# Patient Record
Sex: Male | Born: 1975 | Race: White | Hispanic: No | Marital: Single | Smoking: Current every day smoker
Health system: Southern US, Community
[De-identification: ages and names within clinical notes are randomized; demographics above are authoritative.]

## PROBLEM LIST (undated history)

## (undated) DIAGNOSIS — E785 Hyperlipidemia, unspecified: Secondary | ICD-10-CM

## (undated) DIAGNOSIS — I10 Essential (primary) hypertension: Secondary | ICD-10-CM

## (undated) DIAGNOSIS — E119 Type 2 diabetes mellitus without complications: Secondary | ICD-10-CM

## (undated) HISTORY — DX: Hyperlipidemia, unspecified: E78.5

## (undated) HISTORY — DX: Essential (primary) hypertension: I10

## (undated) HISTORY — DX: Type 2 diabetes mellitus without complications: E11.9

## (undated) HISTORY — PX: KNEE SURGERY: SHX244

---

## 2010-02-17 ENCOUNTER — Emergency Department: Payer: Self-pay

## 2012-10-15 ENCOUNTER — Ambulatory Visit: Payer: Self-pay

## 2016-01-14 ENCOUNTER — Encounter: Payer: Managed Care, Other (non HMO) | Attending: Family Medicine | Admitting: *Deleted

## 2016-01-14 ENCOUNTER — Encounter: Payer: Self-pay | Admitting: *Deleted

## 2016-01-14 VITALS — BP 120/90 | Ht 72.0 in | Wt 199.6 lb

## 2016-01-14 DIAGNOSIS — E119 Type 2 diabetes mellitus without complications: Secondary | ICD-10-CM | POA: Insufficient documentation

## 2016-01-14 NOTE — Patient Instructions (Addendum)
Check blood sugars 2 x day before breakfast and 2 hrs after supper every day Exercise: Begin walking for  10-15  minutes  3-4 days a week and gradually increase to 150 minutes/week No strenuous exercise until blood sugars less than 250  Eat 3 meals day, 1-2  snacks a day Space meals 4-6 hours apart Avoid sugar sweetened drinks (tea) Drink plenty of water Make an eye doctor appointment Quit smoking Bring blood sugar records to the next class

## 2016-01-14 NOTE — Progress Notes (Signed)
Diabetes Self-Management Education  Visit Type: First/Initial  Appt. Start Time: 0910 Appt. End Time: 1025  01/14/2016  Mr. Clinton Clark, identified by name and date of birth, is a 40 y.o. male with a diagnosis of Diabetes: Type 2.   ASSESSMENT  Blood pressure 120/90, height 6' (1.829 m), weight 199 lb 9.6 oz (90.538 kg). Body mass index is 27.06 kg/(m^2).      Diabetes Self-Management Education - 01/14/16 1136    Visit Information   Visit Type First/Initial   Initial Visit   Diabetes Type Type 2   Are you currently following a meal plan? No   Are you taking your medications as prescribed? Yes   Date Diagnosed Sept 2016   Health Coping   How would you rate your overall health? Fair   Psychosocial Assessment   Patient Belief/Attitude about Diabetes Motivated to manage diabetes  "I want to live for my 2 small children"   Self-care barriers None   Self-management support Doctor's office;Family   Other persons present Spouse/SO   Patient Concerns Nutrition/Meal planning;Medication;Healthy Lifestyle;Glycemic Control   Special Needs None   Preferred Learning Style Auditory;Visual;Hands on   Learning Readiness Change in progress   How often do you need to have someone help you when you read instructions, pamphlets, or other written materials from your doctor or pharmacy? 1 - Never   What is the last grade level you completed in school? 12th   Complications   Last HgB A1C per patient/outside source 13.1 %  08/17/15   How often do you check your blood sugar? 1-2 times/day   Fasting Blood glucose range (mg/dL) >161  Pt reports FBG's usually 200's mg/dL.    Postprandial Blood glucose range (mg/dL) --  Pt reports blood sugars prior to supper are 200-350's mg/dL.   Have you had a dilated eye exam in the past 12 months? No   Have you had a dental exam in the past 12 months? Yes   Are you checking your feet? No   Dietary Intake   Breakfast oatmeal; eggs with trukey bacon; cottage  cheese and fruit   Lunch leftovers from supper   Dinner grilled fish or chicken or meat with rice, potatoes, pasta, green vegetables   Beverage(s) water, sugar sweetened tea   Exercise   Exercise Type ADL's   Patient Education   Previous Diabetes Education No   Disease state  Definition of diabetes, type 1 and 2, and the diagnosis of diabetes   Nutrition management  Role of diet in the treatment of diabetes and the relationship between the three main macronutrients and blood glucose level;Carbohydrate counting   Physical activity and exercise  Role of exercise on diabetes management, blood pressure control and cardiac health.   Medications Reviewed patients medication for diabetes, action, purpose, timing of dose and side effects.   Monitoring Purpose and frequency of SMBG.;Identified appropriate SMBG and/or A1C goals.   Chronic complications Relationship between chronic complications and blood glucose control   Psychosocial adjustment Identified and addressed patients feelings and concerns about diabetes   Personal strategies to promote health Review risk of smoking and offered smoking cessation   Individualized Goals (developed by patient)   Reducing Risk Improve blood sugars Decrease medications Prevent diabetes complications Lead a healthier lifestyle Become more fit Quit smoking   Outcomes   Expected Outcomes Demonstrated interest in learning. Expect positive outcomes      Individualized Plan for Diabetes Self-Management Training:   Learning Objective:  Patient will have  a greater understanding of diabetes self-management. Patient education plan is to attend individual and/or group sessions per assessed needs and concerns.   Plan:   Patient Instructions  Check blood sugars 2 x day before breakfast and 2 hrs after supper every day Exercise: Begin walking for  10-15  minutes  3-4 days a week and gradually increase to 150 minutes/week No strenuous exercise until blood sugars  less than 250  Eat 3 meals day, 1-2  snacks a day Space meals 4-6 hours apart Avoid sugar sweetened drinks (tea) Drink plenty of water Make an eye doctor appointment Quit smoking Bring blood sugar records to the next class  Expected Outcomes:  Demonstrated interest in learning. Expect positive outcomes  Education material provided:  General Meal Planning Guidelines Simple Meal Plan  If problems or questions, patient to contact team via:   Sharion Settler, RN, CCM, CDE [phone]  Future DSME appointment:   Monday February 04, 2016 for Class 1

## 2016-02-04 ENCOUNTER — Telehealth: Payer: Self-pay | Admitting: Dietician

## 2016-02-04 ENCOUNTER — Ambulatory Visit: Payer: Managed Care, Other (non HMO)

## 2016-02-04 NOTE — Telephone Encounter (Signed)
Pt did not come to diabetes class tonight-called pt-no answer-left message on voice mail for pt to call us to reschedule diabetes classes and that new class series begins on 03-03-16-pt to call us to confirm

## 2016-02-11 ENCOUNTER — Ambulatory Visit: Payer: Managed Care, Other (non HMO)

## 2016-02-18 ENCOUNTER — Ambulatory Visit: Payer: Managed Care, Other (non HMO)

## 2016-03-06 ENCOUNTER — Encounter: Payer: Self-pay | Admitting: *Deleted

## 2016-08-10 ENCOUNTER — Encounter: Payer: Self-pay | Admitting: *Deleted

## 2016-08-10 ENCOUNTER — Ambulatory Visit (INDEPENDENT_AMBULATORY_CARE_PROVIDER_SITE_OTHER): Payer: Managed Care, Other (non HMO)

## 2016-08-10 ENCOUNTER — Ambulatory Visit
Admission: EM | Admit: 2016-08-10 | Discharge: 2016-08-10 | Disposition: A | Discharge status: Home / Self Care | Payer: Managed Care, Other (non HMO) | Attending: Family Medicine | Admitting: Family Medicine

## 2016-08-10 DIAGNOSIS — T148 Other injury of unspecified body region: Secondary | ICD-10-CM

## 2016-08-10 DIAGNOSIS — IMO0002 Reserved for concepts with insufficient information to code with codable children: Secondary | ICD-10-CM

## 2016-08-10 MED ORDER — LIDOCAINE HCL (PF) 1 % IJ SOLN
2.0000 mL | Freq: Once | INTRAMUSCULAR | Status: AC
  Administered 2016-08-10: 2 mL

## 2016-08-10 MED ORDER — NAPROXEN 500 MG PO TABS: 500.0000 mg | ORAL_TABLET | Freq: Two times a day (BID) | ORAL | 0 refills | Status: AC

## 2016-08-10 MED ORDER — CEPHALEXIN 500 MG PO CAPS: 500.0000 mg | ORAL_CAPSULE | Freq: Four times a day (QID) | ORAL | 0 refills | Status: DC

## 2016-08-10 MED ORDER — TETANUS-DIPHTH-ACELL PERTUSSIS 5-2.5-18.5 LF-MCG/0.5 IM SUSP
0.5000 mL | Freq: Once | INTRAMUSCULAR | Status: AC
  Administered 2016-08-10: 0.5 mL via INTRAMUSCULAR

## 2016-08-10 NOTE — ED Triage Notes (Signed)
Patient was adjusting a saw blade when he lacerated the palm of his right hand.

## 2016-08-10 NOTE — Discharge Instructions (Signed)
Wash with soap and water  Apply small amount of neosporin to area.

## 2016-08-10 NOTE — ED Provider Notes (Signed)
CSN: [id number]     Arrival date & time 08/10/16  1438 History   None    Chief Complaint  Patient presents with  . Extremity Laceration   (Consider location/radiation/quality/duration/timing/severity/associated sxs/prior Treatment) Pt came in with a cut to rt hand after working on a saw approx 1 hour ago. Cut is to the palm of rt hand. Able to move all digits on hand, bleeding controlled.area is approx 2x2 flap. Unknown if pt has had a tetnus in the past 10 yrs. Hand wrapped in gauze prior to arrival.       Past Medical History:  Diagnosis Date  . Diabetes mellitus without complication (HCC)   . Hyperlipidemia   . Hypertension    Past Surgical History:  Procedure Laterality Date  . KNEE SURGERY Left    acl, mcl   Family History  Problem Relation Age of Onset  . Diabetes Maternal Grandmother    Social History  Substance Use Topics  . Smoking status: Current Every Day Smoker    Packs/day: 1.00    Years: 18.00    Types: Cigarettes  . Smokeless tobacco: Never Used  . Alcohol use 3.0 oz/week    5 Cans of beer per week    Review of Systems  Allergies  Review of patient's allergies indicates no known allergies.  Home Medications   Prior to Admission medications   Medication Sig Start Date End Date Taking? Authorizing Provider  fenofibrate (TRICOR) 145 MG tablet Take 1 tablet by mouth daily. 08/17/15 08/16/16 Yes Historical Provider, MD  metFORMIN (GLUCOPHAGE) 850 MG tablet Take 1 tablet by mouth 2 (two) times daily. 08/18/15 08/17/16 Yes Historical Provider, MD  metoprolol (LOPRESSOR) 100 MG tablet Take 1 tablet by mouth 2 (two) times daily. 08/17/15 08/16/16 Yes Historical Provider, MD  omeprazole (PRILOSEC) 20 MG capsule Take 1 capsule by mouth daily. 08/17/15  Yes Historical Provider, MD  cephALEXin (KEFLEX) 500 MG capsule Take 1 capsule (500 mg total) by mouth 4 (four) times daily. 08/10/16   Tobi Bastos, NP  naproxen (NAPROSYN) 500 MG tablet Take 1 tablet (500 mg  total) by mouth 2 (two) times daily. 08/10/16   Tobi Bastos, NP  nystatin cream (MYCOSTATIN) Apply 1 application topically 2 (two) times daily as needed. 08/17/15 08/16/16  Historical Provider, MD  Omega-3 Fatty Acids (FISH OIL TRIPLE STRENGTH) 1400 MG CAPS Take 1 capsule by mouth daily.    Historical Provider, MD   Meds Ordered and Administered this Visit   Medications  Tdap (BOOSTRIX) injection 0.5 mL (0.5 mLs Intramuscular Given 08/10/16 1521)  lidocaine (PF) (XYLOCAINE) 1 % injection 2 mL (2 mLs Other Given 08/10/16 1537)    BP (!) 133/98 (BP Location: Left Arm)   Pulse 82   Temp 97.9 F (36.6 C) (Oral)   Resp 16   Ht 6' (1.829 m)   Wt 200 lb (90.7 kg)   SpO2 97%   BMI 27.12 kg/m  No data found.   Physical Exam  Constitutional: He appears well-developed and well-nourished.  Cardiovascular: Normal rate, regular rhythm and normal heart sounds.   Pulmonary/Chest: Effort normal and breath sounds normal.  Musculoskeletal: Normal range of motion.  Skin: Skin is warm.  Laceration to palm of rt hand approx 2x2 flap near middle finger near web of digit     Urgent Care Course   Clinical Course    .Marland KitchenLaceration Repair Date/Time: 08/10/2016 3:16 PM Performed by: Maple Mirza A Authorized by: Maple Mirza A   Consent:  Consent obtained:  Verbal   Consent given by:  Patient   Risks discussed:  Infection, need for additional repair, pain, poor cosmetic result, tendon damage, poor wound healing and nerve damage   Alternatives discussed:  No treatment Anesthesia (see MAR for exact dosages):    Anesthesia method:  Local infiltration   Local anesthetic:  Lidocaine 1% WITH epi Laceration details:    Location:  Hand   Hand location:  R palm Repair type:    Repair type:  Intermediate Pre-procedure details:    Preparation:  Patient was prepped and draped in usual sterile fashion and imaging obtained to evaluate for foreign bodies Exploration:    Wound exploration:  wound explored through full range of motion and entire depth of wound probed and visualized   Treatment:    Area cleansed with:  Betadine   Amount of cleaning:  Standard   Irrigation solution:  Sterile saline   Irrigation method:  Pressure wash   Visualized foreign bodies/material removed: no   Skin repair:    Repair method:  Sutures   Suture size:  5-0   Suture material:  Nylon   Suture technique:  Simple interrupted Post-procedure details:    Dressing:  Antibiotic ointment, non-adherent dressing and adhesive bandage   Patient tolerance of procedure:  Tolerated well, no immediate complications     (including critical care time)  Labs Review Labs Reviewed - No data to display  Imaging Review Dg Hand Complete Right  Result Date: 08/10/2016 CLINICAL DATA:  Acute laceration injury to right hand today with saw. Initial encounter. EXAM: RIGHT HAND - COMPLETE 3+ VIEW COMPARISON:  None. FINDINGS: No acute fracture, subluxation or dislocation identified. No definite radiopaque foreign bodies noted. No focal bony lesions are present. IMPRESSION: Negative. Electronically Signed   By: Harmon PierJeffrey  Hu M.D.   On: 08/10/2016 15:36     Visual Acuity Review  Right Eye Distance:   Left Eye Distance:   Bilateral Distance:    Right Eye Near:   Left Eye Near:    Bilateral Near:         MDM   1. Laceration    Take full dose of abx Need to follow up with a hand specialist  Sutures will need to be removed in 7-10 days. Wash and dry area and apply thin layer layer of neosporin       Tobi BastosMelanie A Mardene Lessig, NP 08/10/16 1648

## 2016-08-12 ENCOUNTER — Telehealth: Payer: Self-pay | Admitting: *Deleted

## 2016-08-12 NOTE — Telephone Encounter (Signed)
Follow up call to ask patient how he was doing. Patient reports that he is in some pain from his hand laceration, but otherwise he is ok. Encouraged patient to follow up with [facility] or PCP if laceration becomes inflamed, red, or develops drainage. Patient will follow up with [facility] in 1 week to remove sutures.

## 2018-02-03 IMAGING — CR DG HAND COMPLETE 3+V*R*
3 series · 3 of 3 positions shown · non-contrast
Comparison: None.

CLINICAL DATA: Acute laceration injury to right hand today with
saw. Initial encounter.

EXAM:
RIGHT HAND - COMPLETE 3+ VIEW

[hand ap]
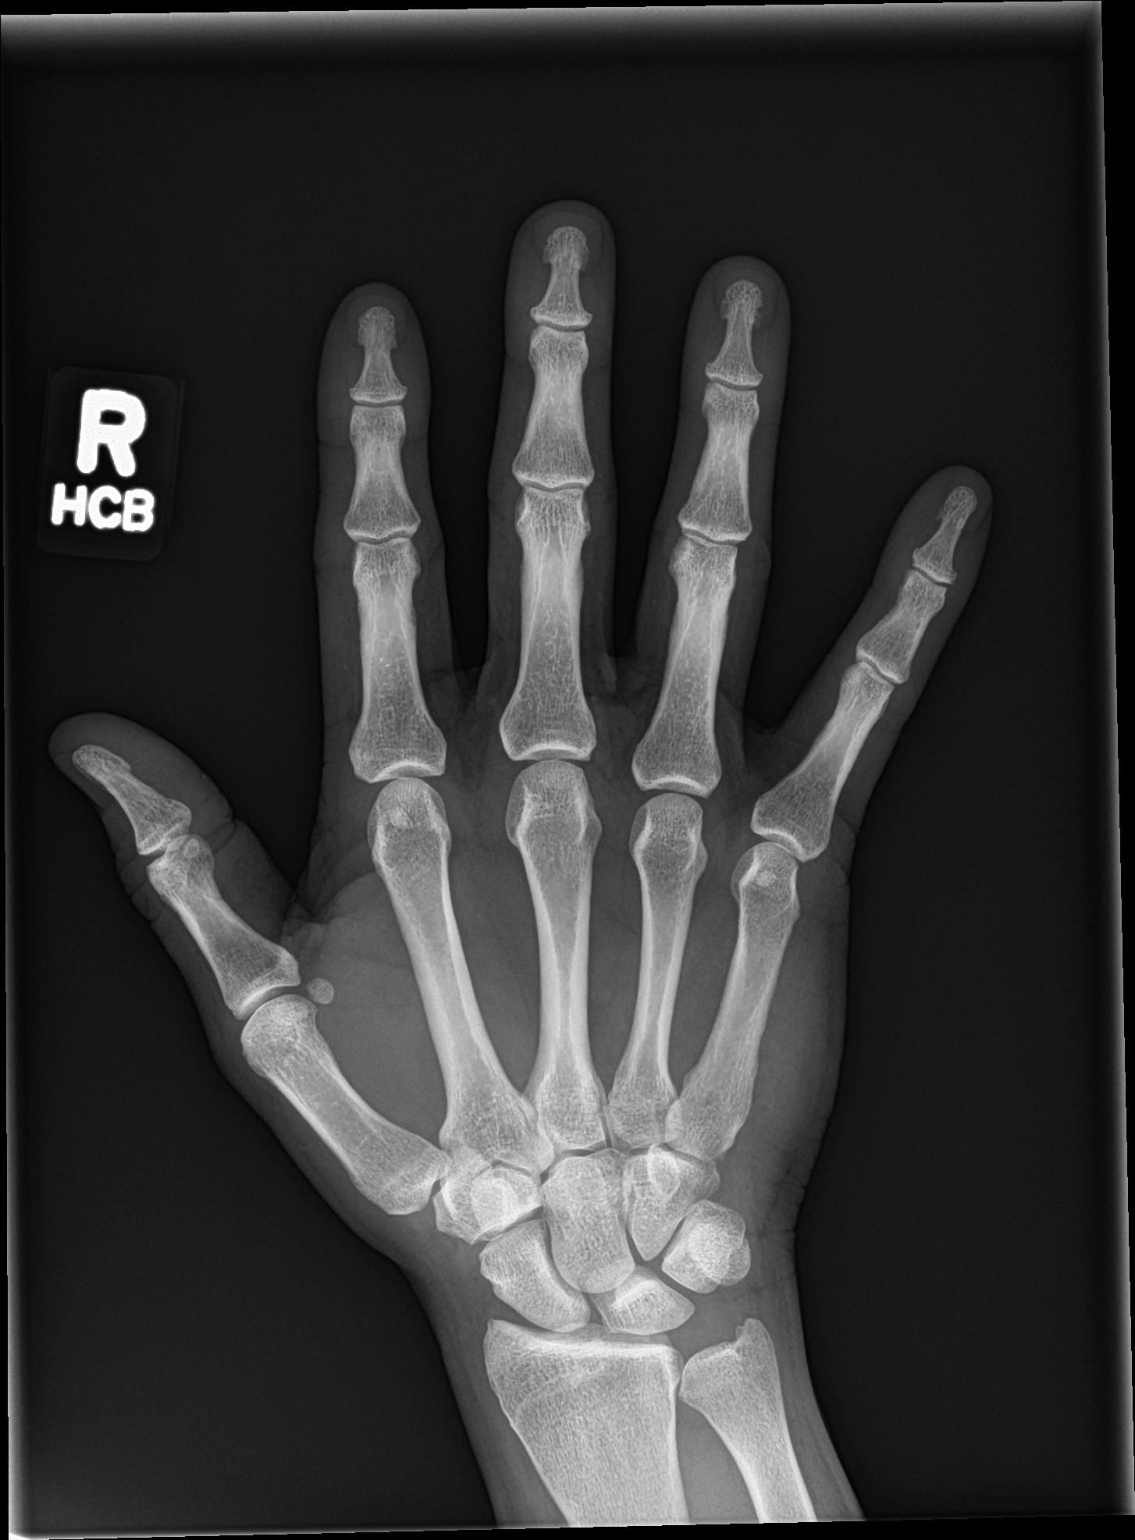

[hand obl]
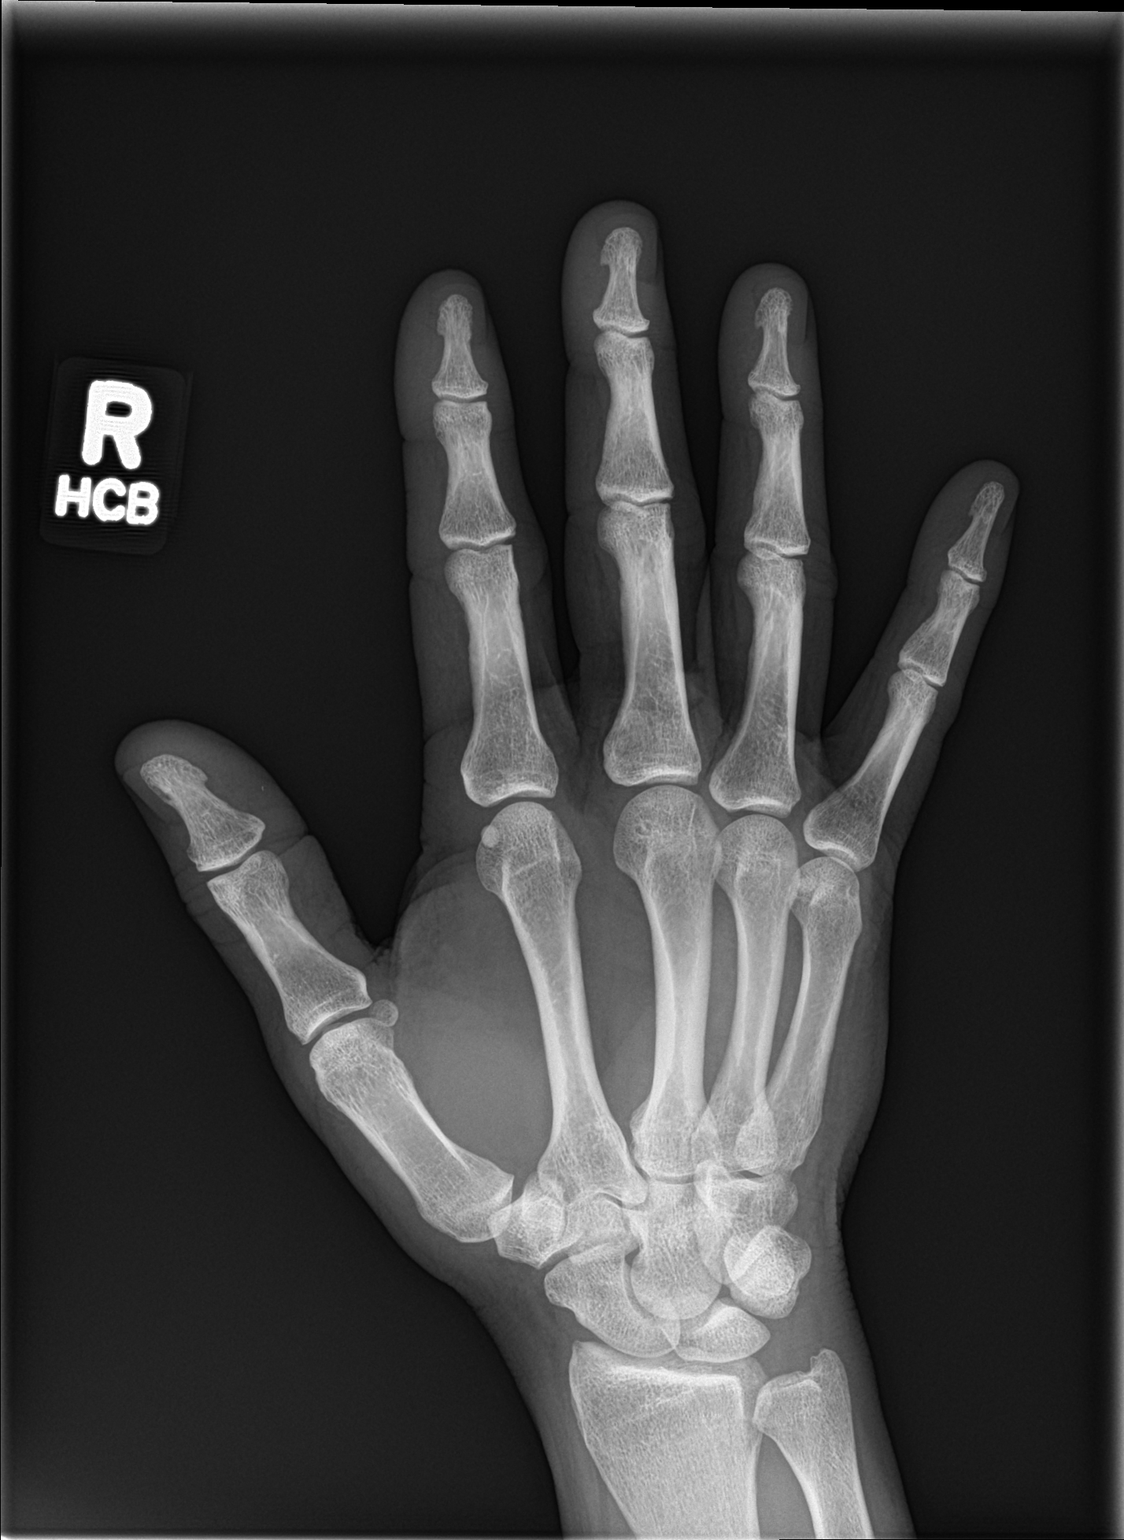

[hand lat]
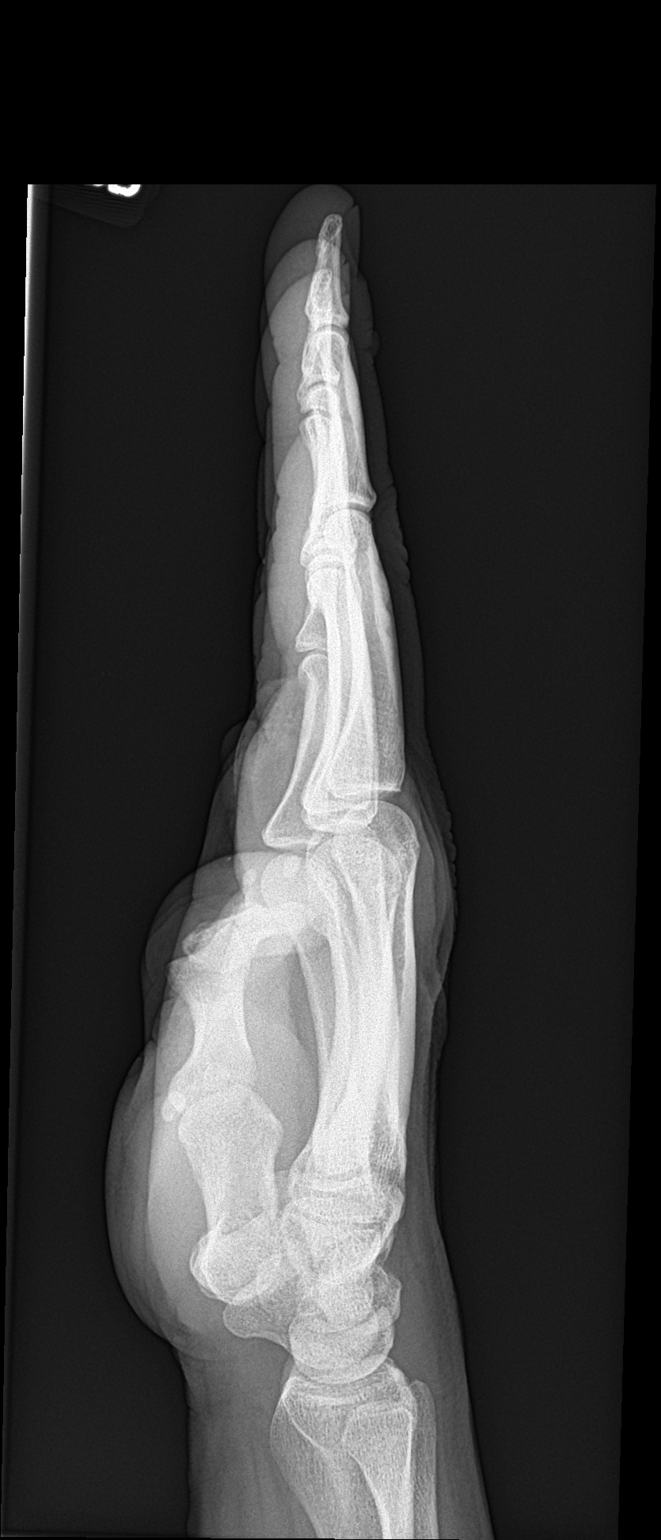

[3 of 3 positions shown; findings below may reference images not displayed]

FINDINGS: No acute fracture, subluxation or dislocation identified.

No definite radiopaque foreign bodies noted.

No focal bony lesions are present.
IMPRESSION: Negative.

## 2021-07-24 ENCOUNTER — Other Ambulatory Visit: Payer: Self-pay

## 2021-07-24 ENCOUNTER — Emergency Department
Admission: EM | Admit: 2021-07-24 | Discharge: 2021-07-24 | Disposition: A | Discharge status: Home / Self Care | Payer: 59 | Attending: Emergency Medicine | Admitting: Emergency Medicine

## 2021-07-24 ENCOUNTER — Ambulatory Visit (INDEPENDENT_AMBULATORY_CARE_PROVIDER_SITE_OTHER): Payer: 59

## 2021-07-24 ENCOUNTER — Ambulatory Visit
Admission: EM | Admit: 2021-07-24 | Discharge: 2021-07-24 | Disposition: A | Discharge status: Other Acute Inpatient Hospital | Payer: 59

## 2021-07-24 DIAGNOSIS — W293XXA Contact with powered garden and outdoor hand tools and machinery, initial encounter: Secondary | ICD-10-CM | POA: Diagnosis not present

## 2021-07-24 DIAGNOSIS — Y9389 Activity, other specified: Secondary | ICD-10-CM | POA: Diagnosis not present

## 2021-07-24 DIAGNOSIS — Z79899 Other long term (current) drug therapy: Secondary | ICD-10-CM | POA: Diagnosis not present

## 2021-07-24 DIAGNOSIS — Z23 Encounter for immunization: Secondary | ICD-10-CM | POA: Diagnosis not present

## 2021-07-24 DIAGNOSIS — S62522B Displaced fracture of distal phalanx of left thumb, initial encounter for open fracture: Secondary | ICD-10-CM

## 2021-07-24 DIAGNOSIS — S61112A Laceration without foreign body of left thumb with damage to nail, initial encounter: Secondary | ICD-10-CM | POA: Insufficient documentation

## 2021-07-24 DIAGNOSIS — F17210 Nicotine dependence, cigarettes, uncomplicated: Secondary | ICD-10-CM | POA: Diagnosis not present

## 2021-07-24 DIAGNOSIS — S6992XA Unspecified injury of left wrist, hand and finger(s), initial encounter: Secondary | ICD-10-CM | POA: Diagnosis present

## 2021-07-24 DIAGNOSIS — I10 Essential (primary) hypertension: Secondary | ICD-10-CM | POA: Insufficient documentation

## 2021-07-24 DIAGNOSIS — Z7984 Long term (current) use of oral hypoglycemic drugs: Secondary | ICD-10-CM | POA: Diagnosis not present

## 2021-07-24 DIAGNOSIS — E119 Type 2 diabetes mellitus without complications: Secondary | ICD-10-CM | POA: Diagnosis not present

## 2021-07-24 DIAGNOSIS — S61122A Laceration with foreign body of left thumb with damage to nail, initial encounter: Secondary | ICD-10-CM

## 2021-07-24 MED ORDER — CEPHALEXIN 500 MG PO CAPS
1000.0000 mg | ORAL_CAPSULE | Freq: Once | ORAL | Status: AC
  Administered 2021-07-24: 1000 mg via ORAL
  Filled 2021-07-24: qty 2

## 2021-07-24 MED ORDER — SULFAMETHOXAZOLE-TRIMETHOPRIM 800-160 MG PO TABS
1.0000 | ORAL_TABLET | Freq: Once | ORAL | Status: AC
  Administered 2021-07-24: 1 via ORAL
  Filled 2021-07-24: qty 1

## 2021-07-24 MED ORDER — LIDOCAINE HCL (PF) 1 % IJ SOLN
10.0000 mL | Freq: Once | INTRAMUSCULAR | Status: AC
  Administered 2021-07-24: 10 mL via INTRADERMAL
  Filled 2021-07-24: qty 10

## 2021-07-24 MED ORDER — BUPIVACAINE HCL 0.5 % IJ SOLN: 10.0000 mL | Freq: Once | INTRAMUSCULAR | Status: DC

## 2021-07-24 MED ORDER — CEPHALEXIN 500 MG PO CAPS: 500.0000 mg | ORAL_CAPSULE | Freq: Four times a day (QID) | ORAL | 0 refills | Status: AC

## 2021-07-24 MED ORDER — TETANUS-DIPHTH-ACELL PERTUSSIS 5-2.5-18.5 LF-MCG/0.5 IM SUSY
0.5000 mL | PREFILLED_SYRINGE | Freq: Once | INTRAMUSCULAR | Status: AC
  Administered 2021-07-24: 0.5 mL via INTRAMUSCULAR
  Filled 2021-07-24: qty 0.5

## 2021-07-24 MED ORDER — SULFAMETHOXAZOLE-TRIMETHOPRIM 800-160 MG PO TABS: 1.0000 | ORAL_TABLET | Freq: Two times a day (BID) | ORAL | 0 refills | Status: AC

## 2021-07-24 MED ORDER — BUPIVACAINE HCL 0.25 % IJ SOLN
10.0000 mL | Freq: Once | INTRAMUSCULAR | Status: AC
  Administered 2021-07-24: 10 mL
  Filled 2021-07-24: qty 10

## 2021-07-24 NOTE — ED Provider Notes (Signed)
[facility] Emergency Department Provider Note  ____________________________________________   Event Date/Time   First MD Initiated Contact with Patient 07/24/21 1834     (approximate)  I have reviewed the triage vital signs and the nursing notes.   HISTORY  Chief Complaint Laceration   HPI Clinton Clark is a 45 y.o. male who presents to the emergency department for evaluation of laceration to the left thumb.  Patient states that he was using a band saw to cut a small fragment off of an aluminum pipe when it caught and his left thumb struck the blade.  He first presented to urgent care, where x-rays were obtained and there was concern regarding fracture site and he was sent here for further evaluation.  The patient reports that the tip of his thumb is numb, however he denies significant pain at this time.  He reports his last tetanus was roughly 5 years ago.  He denies any other complaints.        Past Medical History:  Diagnosis Date   Diabetes mellitus without complication (HCC)    Hyperlipidemia    Hypertension     There are no problems to display for this patient.   Past Surgical History:  Procedure Laterality Date   KNEE SURGERY Left    acl, mcl    Prior to Admission medications   Medication Sig Start Date End Date Taking? Authorizing Provider  cephALEXin (KEFLEX) 500 MG capsule Take 1 capsule (500 mg total) by mouth 4 (four) times daily for 10 days. 07/24/21 08/03/21 Yes Ariyonna Twichell, Ruben Gottron, PA  sulfamethoxazole-trimethoprim (BACTRIM DS) 800-160 MG tablet Take 1 tablet by mouth 2 (two) times daily for 10 days. 07/24/21 08/03/21 Yes Wisdom Rickey, Ruben Gottron, PA  clonazePAM (KLONOPIN) 0.5 MG tablet Take 0.25-0.5 mg by mouth daily as needed. 05/03/21   [provider]  fenofibrate 160 MG tablet Take 160 mg by mouth daily. 07/21/21   [provider]  lisinopril (ZESTRIL) 20 MG tablet Take 20 mg by mouth daily. 07/21/21   [provider]  metFORMIN (GLUCOPHAGE) 850 MG tablet Take 1 tablet by mouth 2 (two) times daily. 08/18/15 07/24/21  [provider]  metoprolol (LOPRESSOR) 100 MG tablet Take 1 tablet by mouth 2 (two) times daily. 08/17/15 08/16/16  [provider]  naproxen (NAPROSYN) 500 MG tablet Take 1 tablet (500 mg total) by mouth 2 (two) times daily. 08/10/16   Coralyn Mark, NP  Omega-3 Fatty Acids (FISH OIL TRIPLE STRENGTH) 1400 MG CAPS Take 1 capsule by mouth daily.    [provider]  omeprazole (PRILOSEC) 20 MG capsule Take 1 capsule by mouth daily. 08/17/15   [provider]    Allergies Patient has no known allergies.  Family History  Problem Relation Age of Onset   Diabetes Maternal Grandmother     Social History Social History   Tobacco Use   Smoking status: Every Day    Packs/day: 1.00    Years: 18.00    Pack years: 18.00    Types: Cigarettes   Smokeless tobacco: Never  Vaping Use   Vaping Use: Never used  Substance Use Topics   Alcohol use: Yes    Alcohol/week: 5.0 standard drinks    Types: 5 Cans of beer per week    Review of Systems  Constitutional: No fever/chills Eyes: No visual changes. ENT: No sore throat. Cardiovascular: Denies chest pain. Respiratory: Denies shortness of breath. Gastrointestinal: No abdominal pain.  No nausea, no  vomiting.  No diarrhea.  No constipation. Genitourinary: Negative for dysuria. Musculoskeletal: + Left thumb injury Skin: Negative for rash. Neurological: Negative for headaches, focal weakness or numbness.  ____________________________________________   PHYSICAL EXAM:  VITAL SIGNS: ED Triage Vitals  Enc Vitals Group     BP 07/24/21 1820 138/88     Pulse Rate 07/24/21 1820 (!) 116     Resp 07/24/21 1820 18     Temp 07/24/21 1820 97.8 F (36.6 C)     Temp Source 07/24/21 1820 Oral     SpO2 07/24/21 1820 98 %     Weight 07/24/21 1821 195 lb (88.5 kg)     Height 07/24/21 1821 6' (1.829  m)     Head Circumference --      Peak Flow --      Pain Score 07/24/21 1821 6     Pain Loc --      Pain Edu? --      Excl. in GC? --    Constitutional: Alert and oriented. Well appearing and in no acute distress. Eyes: Conjunctivae are normal. PERRL. EOMI. Head: Atraumatic. Nose: No congestion/rhinnorhea. Mouth/Throat: Mucous membranes are moist.  Oropharynx non-erythematous. Neck: No stridor.   Cardiovascular: Normal rate, regular rhythm. Grossly normal heart sounds.  Good peripheral circulation. Respiratory: Normal respiratory effort.  No retractions. Lungs CTAB. Gastrointestinal: Soft and nontender. No distention. No abdominal bruits. No CVA tenderness. Musculoskeletal: Left hand: There is a roughly 5 mm deep laceration to the dorsal side of the left thumb which horizontally transects the nail and nailbed leaving a gap.  There is a small amount of involvement of the skin to each side of the nail.  There is no subungual hematoma.  Patient has full flexion and extension without pain of both the IP and MCP.  Radial pulse 2+, capillary refill less than 3 seconds. Neurologic:  Normal speech and language. No gross focal neurologic deficits are appreciated. No gait instability. Skin:  Skin is warm, dry and intact except as described above Psychiatric: Mood and affect are normal. Speech and behavior are normal.  ____________________________________________  RADIOLOGY I, Lucy Chris, personally viewed and evaluated these images (plain radiographs) as part of my medical decision making, as well as reviewing the written report by the radiologist.  ED provider interpretation: X-rays from urgent care earlier today  Official radiology report(s): DG Finger Thumb Left  Result Date: 07/24/2021 CLINICAL DATA:  Status post laceration. EXAM: LEFT THUMB 2+V COMPARISON:  None. FINDINGS: Acute fracture deformity is seen involving the tuft of the distal phalanx of the right thumb. There is no  evidence of dislocation. An ill-defined soft tissue deformity is seen adjacent to the previously noted fracture site. IMPRESSION: Acute fracture of the distal phalanx of the right thumb. Electronically Signed   By: Aram Candela M.D.   On: 07/24/2021 17:08    ____________________________________________   PROCEDURES  Procedure(s) performed (including Critical Care):  Marland KitchenMarland KitchenLaceration Repair  Date/Time: 07/24/2021 8:37 PM Performed by: Lucy Chris, PA Authorized by: Lucy Chris, PA   Consent:    Consent obtained:  Verbal   Consent given by:  Patient   Risks, benefits, and alternatives were discussed: yes     Risks discussed:  Infection, retained foreign body, need for additional repair, poor cosmetic result, tendon damage, nerve damage, vascular damage, poor wound healing and pain   Alternatives discussed:  No treatment and delayed treatment Universal protocol:    Procedure explained and questions answered to patient or  proxy's satisfaction: yes     Imaging studies available: yes     Required blood products, implants, devices, and special equipment available: yes     Site/side marked: yes     Immediately prior to procedure, a time out was called: yes     Patient identity confirmed:  Verbally with patient Anesthesia:    Anesthesia method:  Nerve block   Block needle gauge:  27 G   Block anesthetic:  Lidocaine 1% w/o epi and bupivacaine 0.25% w/o epi   Block technique:  Digital   Block injection procedure:  Anatomic landmarks identified, introduced needle, incremental injection, anatomic landmarks palpated and negative aspiration for blood   Block outcome:  Anesthesia achieved Laceration details:    Location:  Finger   Finger location:  L thumb   Length (cm):  2.5   Depth (mm):  5 Pre-procedure details:    Preparation:  Patient was prepped and draped in usual sterile fashion and imaging obtained to evaluate for foreign bodies Exploration:    Limited defect created  (wound extended): no     Hemostasis achieved with:  Direct pressure   Imaging obtained: x-ray     Foreign body noted: multiple fragments, indeterminate if bone or related to sawing material.     Wound exploration: wound explored through full range of motion and entire depth of wound visualized     Wound extent: foreign bodies/material and underlying fracture     Contaminated: yes   Treatment:    Area cleansed with:  Povidone-iodine   Amount of cleaning:  Extensive   Irrigation solution:  Sterile saline   Irrigation method:  Syringe and tap   Foreign body removal: none able to be visualized directly.   Skin repair:    Repair method:  Sutures   Suture size:  4-0 and 5-0   Suture material:  Nylon   Suture technique:  Simple interrupted   Number of sutures: 2 5-0 nylon used on either side of the nail bed to stabilize; 3 4-0 Monocryl used after removal of the distal nail to stabilize the nail bed laceration. Approximation:    Approximation:  Close Repair type:    Repair type:  Complex Post-procedure details:    Dressing:  Non-adherent dressing and bulky dressing   Procedure completion:  Tolerated well, no immediate complications Comments:     Procedure assisted by Catha Gosselinari Beth Triplett, NP. See above for suture location. Given underlying fracture, will place on Abx, recommend hand surgery follow up.   ____________________________________________   INITIAL IMPRESSION / ASSESSMENT AND PLAN / ED COURSE  As part of my medical decision making, I reviewed the following data within the electronic MEDICAL RECORD NUMBER Nursing notes reviewed and incorporated, Radiograph reviewed, and Notes from prior ED visits        Patient is a 45 year old male who presents to the emergency department for evaluation of laceration to the left thumb, he was referred by urgent care.  See HPI for further details.  In triage patient's tachycardic but otherwise has normal vital signs.  Physical exam as above,  patient does have extensive damage to the nail and laceration goes through the nailbed with a portion of the tip of his finger hanging.  Tendons are intact with normal, pain-free range of motion of the IP and MCP without difficulty.  Neurovascularly intact.  X-rays were visualized from urgent care visit prior to arrival which demonstrates a comminuted distal tuft fracture, unsure if any foreign bodies present.  Patient's  tetanus was updated.  Laceration was repaired, see procedure note.  Given open fracture, patient was placed on double antibiotic coverage with Keflex and Bactrim.  Return precautions were discussed at length.  We will have the patient follow-up with hand surgery given complexity of laceration and high risk for infection and complication.  Patient is amenable with this plan, stable this time for outpatient follow-up.      ____________________________________________   FINAL CLINICAL IMPRESSION(S) / ED DIAGNOSES  Final diagnoses:  Laceration of left thumb with damage to nail, foreign body presence unspecified, initial encounter     ED Discharge Orders          Ordered    sulfamethoxazole-trimethoprim (BACTRIM DS) 800-160 MG tablet  2 times daily        07/24/21 2018    cephALEXin (KEFLEX) 500 MG capsule  4 times daily        07/24/21 2018             Note:  This document was prepared using Dragon voice recognition software and may include unintentional dictation errors.    Lucy Chris, PA 07/24/21 2044    Gilles Chiquito, MD 07/24/21 2229

## 2021-07-24 NOTE — ED Triage Notes (Signed)
Pt c/o laceration to his left thumb while using a band saw today. Pt does have laceration through the nail bed, there does appear to be some slight color change to the severed nail portion.

## 2021-07-24 NOTE — ED Notes (Signed)
Patient is being discharged from the Urgent Care and sent to the Emergency Department via POV . Per Becky Augusta NP, patient is in need of higher level of care due to xray results and need for higher level care. Patient is aware and verbalizes understanding of plan of care.  Vitals:   07/24/21 1602  BP: (!) 124/96  Pulse: 99  Resp: 18  Temp: 98.4 F (36.9 C)  SpO2: 98%

## 2021-07-24 NOTE — Discharge Instructions (Addendum)
Please take the antibiotics as prescribed for the next 10 days.  Please call [facility] tomorrow to arrange close follow-up with the hand surgeon.  You will need the 2 outer sutures removed in approximately 7 to 10 days.  Return to the emergency department if you experience any worsening pain, drainage, or other worsening.

## 2021-07-24 NOTE — ED Notes (Signed)
Called several times from lobby & outside with no answer; no answer when phone #'s listed in chart called

## 2021-07-24 NOTE — ED Triage Notes (Signed)
Pt via POV from home. Pt was seen at [facility] today. Pt cut his L thumb on a band saw around 2:30pm. Pt had an X-Ray done at [facility] and they said he may have fractured a bone. Pt is A&Ox4 and NAD. Bleeding controlled.

## 2021-07-24 NOTE — ED Provider Notes (Signed)
[facility]    CSN: [id number] Arrival date & time: 07/24/21  1447      History   Chief Complaint Chief Complaint  Patient presents with   Laceration    Left thumb    HPI Clinton Clark is a 45 y.o. male.   HPI  45 year old male here for evaluation of laceration to the left thumb.  Patient reports that he was using a portable band Soltran to cut a piece of pipe when the saw slipped and he cut through the tip of his left thumb.  The laceration goes through the nail, into the nailbed, and partly to the thumb.  He states that the tip of the thumb is numb and he is not having significant pain at the site of the laceration.  He is no pain or tenderness with the IP joint or with the proximal phalanx to the thumb.  No other fingers are injured.  Past Medical History:  Diagnosis Date   Diabetes mellitus without complication (HCC)    Hyperlipidemia    Hypertension     There are no problems to display for this patient.   Past Surgical History:  Procedure Laterality Date   KNEE SURGERY Left    acl, mcl       Home Medications    Prior to Admission medications   Medication Sig Start Date End Date Taking? Authorizing Provider  clonazePAM (KLONOPIN) 0.5 MG tablet Take 0.25-0.5 mg by mouth daily as needed. 05/03/21  Yes [provider]  fenofibrate 160 MG tablet Take 160 mg by mouth daily. 07/21/21  Yes [provider]  lisinopril (ZESTRIL) 20 MG tablet Take 20 mg by mouth daily. 07/21/21  Yes [provider]  metFORMIN (GLUCOPHAGE) 850 MG tablet Take 1 tablet by mouth 2 (two) times daily. 08/18/15 07/24/21 Yes [provider]  Omega-3 Fatty Acids (FISH OIL TRIPLE STRENGTH) 1400 MG CAPS Take 1 capsule by mouth daily.   Yes [provider]  omeprazole (PRILOSEC) 20 MG capsule Take 1 capsule by mouth daily. 08/17/15  Yes [provider]  cephALEXin (KEFLEX) 500 MG capsule Take 1 capsule (500 mg total) by mouth 4  (four) times daily. 08/10/16   Coralyn Mark, NP  metoprolol (LOPRESSOR) 100 MG tablet Take 1 tablet by mouth 2 (two) times daily. 08/17/15 08/16/16  [provider]  naproxen (NAPROSYN) 500 MG tablet Take 1 tablet (500 mg total) by mouth 2 (two) times daily. 08/10/16   Coralyn Mark, NP    Family History Family History  Problem Relation Age of Onset   Diabetes Maternal Grandmother     Social History Social History   Tobacco Use   Smoking status: Every Day    Packs/day: 1.00    Years: 18.00    Pack years: 18.00    Types: Cigarettes   Smokeless tobacco: Never  Vaping Use   Vaping Use: Never used  Substance Use Topics   Alcohol use: Yes    Alcohol/week: 5.0 standard drinks    Types: 5 Cans of beer per week     Allergies   Patient has no known allergies.   Review of Systems Review of Systems  Constitutional:  Negative for activity change, appetite change and fever.  Skin:  Positive for wound.  Neurological:  Positive for numbness. Negative for weakness.  Hematological: Negative.   Psychiatric/Behavioral: Negative.      Physical Exam Triage Vital Signs ED Triage Vitals  Enc Vitals Group  BP 07/24/21 1602 (!) 124/96     Pulse Rate 07/24/21 1602 99     Resp 07/24/21 1602 18     Temp 07/24/21 1602 98.4 F (36.9 C)     Temp Source 07/24/21 1602 Oral     SpO2 07/24/21 1602 98 %     Weight 07/24/21 1559 195 lb (88.5 kg)     Height 07/24/21 1559 6' (1.829 m)     Head Circumference --      Peak Flow --      Pain Score 07/24/21 1558 7     Pain Loc --      Pain Edu? --      Excl. in GC? --    No data found.  Updated Vital Signs BP (!) 124/96 (BP Location: Right Arm)   Pulse 99   Temp 98.4 F (36.9 C) (Oral)   Resp 18   Ht 6' (1.829 m)   Wt 195 lb (88.5 kg)   SpO2 98%   BMI 26.45 kg/m   Visual Acuity Right Eye Distance:   Left Eye Distance:   Bilateral Distance:    Right Eye Near:   Left Eye Near:    Bilateral Near:      Physical Exam Vitals and nursing note reviewed.  Constitutional:      General: He is not in acute distress.    Appearance: Normal appearance. He is normal weight. He is not ill-appearing.  Musculoskeletal:        General: Signs of injury present. No swelling, tenderness or deformity. Normal range of motion.  Skin:    General: Skin is warm and dry.     Capillary Refill: Capillary refill takes less than 2 seconds.     Findings: No bruising.  Neurological:     General: No focal deficit present.     Mental Status: He is alert and oriented to person, place, and time.  Psychiatric:        Mood and Affect: Mood normal.        Behavior: Behavior normal.        Thought Content: Thought content normal.        Judgment: Judgment normal.     UC Treatments / Results  Labs (all labs ordered are listed, but only abnormal results are displayed) Labs Reviewed - No data to display  EKG   Radiology No results found.  Procedures Procedures (including critical care time)  Medications Ordered in UC Medications - No data to display  Initial Impression / Assessment and Plan / UC Course  I have reviewed the triage vital signs and the nursing notes.  Pertinent labs & imaging results that were available during my care of the patient were reviewed by me and considered in my medical decision making (see chart for details).  Pleasant, nontoxic-appearing 45 year old male who is here for evaluation of a laceration through the distal aspect of his left thumb that occurred while using a band saw this afternoon.  He has full range of motion and normal anatomical alignment of his left thumb.  He is complaining of numbness at the tip.  The laceration extends horizontally across the nail and the middle and extends into the nailbed below.  Patient has no tenderness to palpation of the IP joint, proximal phalanx, or MCP joint.  There are no other visible wounds present.  Due to the depth of the wound and the  mechanism there is concern for open fracture.  Will obtain radiograph of left  thumb before proceeding with repair.  I have informed the patient that if he has an open fracture he is going to require antibiotics.  If there is debris within the wound I would refer him to the emergency department for evaluation and washout.  Left on x-rays independently reviewed and evaluated by me.  Impression: Patient is amputated the tip of his distal phalanx and there are multiple bone shards and fragments throughout the wound.  The ends of the bone are jagged.  Awaiting radiology read.  Due to the multiple bone fragments and the jagged bone ends I am recommending the patient that he go to the ER for evaluation and management as I do not have rongeurs to smooth out the bone ends.  Patient's thumb was dressed with a nonadherent dry dressing and patient discharged ambulatory.   Final Clinical Impressions(s) / UC Diagnoses   Final diagnoses:  Laceration of left thumb with foreign body and damage to nail, initial encounter  Open displaced fracture of distal phalanx of left thumb, initial encounter     Discharge Instructions      As we discussed, I do not have the equipment here to smooth out the bone ends and adequately repair your thumb laceration since you have multiple bone fragments and shards scattered throughout the wound.  Therefore, I am recommending you go to the emergency department for evaluation and treatment.     ED Prescriptions   None    PDMP not reviewed this encounter.   Becky Augusta, NP 07/24/21 1705

## 2021-07-24 NOTE — Discharge Instructions (Signed)
As we discussed, I do not have the equipment here to smooth out the bone ends and adequately repair your thumb laceration since you have multiple bone fragments and shards scattered throughout the wound.  Therefore, I am recommending you go to the emergency department for evaluation and treatment.

## 2022-03-11 ENCOUNTER — Ambulatory Visit (INDEPENDENT_AMBULATORY_CARE_PROVIDER_SITE_OTHER): Payer: 59 | Admitting: Urology

## 2022-03-11 ENCOUNTER — Encounter: Payer: Self-pay | Admitting: Urology

## 2022-03-11 VITALS — BP 134/89 | HR 99 | Ht 72.0 in | Wt 192.0 lb

## 2022-03-11 DIAGNOSIS — Z3009 Encounter for other general counseling and advice on contraception: Secondary | ICD-10-CM | POA: Diagnosis not present

## 2022-03-11 MED ORDER — DIAZEPAM 5 MG PO TABS: 5.0000 mg | ORAL_TABLET | Freq: Once | ORAL | 0 refills | Status: AC | PRN

## 2022-03-11 NOTE — Progress Notes (Signed)
? ?  03/11/22 ?8:58 AM  ? ?Clinton Clark ?May 09, 1976 ?[id number] ? ?CC: Discuss vasectomy ? ?HPI: ?46 year old male interested in vasectomy for permanent sterilization.  He has a 35 and 69-year-old, and he and his partner do not desire any further pregnancies.  He denies any urinary symptoms or family history of prostate cancer. ? ? ?PMH: ?Past Medical History:  ?Diagnosis Date  ? Diabetes mellitus without complication (HCC)   ? Hyperlipidemia   ? Hypertension   ? ? ?Surgical History: ?Past Surgical History:  ?Procedure Laterality Date  ? KNEE SURGERY Left   ? acl, mcl  ? ? ? ?Family History: ?Family History  ?Problem Relation Age of Onset  ? Diabetes Maternal Grandmother   ? ? ?Social History:  reports that he has been smoking cigarettes. He has a 18.00 pack-year smoking history. He has never used smokeless tobacco. He reports current alcohol use of about 5.0 standard drinks per week. No history on file for drug use. ? ?Physical Exam: ?BP 134/89   Pulse 99   Ht 6' (1.829 m)   Wt 192 lb (87.1 kg)   BMI 26.04 kg/m?   ? ?Constitutional:  Alert and oriented, No acute distress. ?Cardiovascular: No clubbing, cyanosis, or edema. ?Respiratory: Normal respiratory effort, no increased work of breathing. ?GI: Abdomen is soft, nontender, nondistended, no abdominal masses ?GU: Phallus with patent meatus, testicles 20 cc and descended bilaterally without masses, vas deferens palpable bilaterally ? ?Assessment & Plan:   ?46 year old male interested in vasectomy for permanent sterilization. ? ?We discussed the risks and benefits of vasectomy at length.  Vasectomy is intended to be a permanent form of contraception, and does not produce immediate sterility.  Following vasectomy another form of contraception is required until vas occlusion is confirmed by a post-vasectomy semen analysis obtained 2-3 months after the procedure.  Even after vas occlusion is confirmed, vasectomy is not 100% reliable in preventing pregnancy, and the  failure rate is approximately 11/1998.  Repeat vasectomy is required in less than 1% of patients.  He should refrain from ejaculation for 1 week after vasectomy.  Options for fertility after vasectomy include vasectomy reversal, and sperm retrieval with in vitro fertilization or ICSI.  These options are not always successful and may be expensive.  Finally, there are other permanent and non-permanent alternatives to vasectomy available. There is no risk of erectile dysfunction, and the volume of semen will be similar to prior, as the majority of the ejaculate is from the prostate and seminal vesicles.  ? ?The procedure takes ~20 minutes.  We recommend patients take 5-10 mg of Valium 30 minutes prior, and he will need a driver post-procedure.  Local anesthetic is injected into the scrotal skin and a small segment of the vas deferens is removed, and the ends occluded. The complication rate is approximately 1-2%, and includes bleeding, infection, and development of chronic scrotal pain. ? ?PLAN: ?Schedule vasectomy ?Valium sent to pharmacy ? ? ?Legrand Rams, MD ?03/11/2022 ? ?[facility] ?[address], [address] ?[place], [place] [address] ?[phone] ? ?

## 2022-03-11 NOTE — Patient Instructions (Signed)

## 2022-04-01 ENCOUNTER — Encounter: Payer: Self-pay | Admitting: Urology

## 2022-04-01 ENCOUNTER — Ambulatory Visit: Payer: 59 | Admitting: Urology

## 2022-04-01 DIAGNOSIS — Z302 Encounter for sterilization: Secondary | ICD-10-CM | POA: Diagnosis not present

## 2022-04-01 DIAGNOSIS — Z3009 Encounter for other general counseling and advice on contraception: Secondary | ICD-10-CM

## 2022-04-01 NOTE — Progress Notes (Signed)
VASECTOMY PROCEDURE NOTE: ? ?The patient was taken to the minor procedure room and placed in the supine position. His genitals were prepped and draped in the usual sterile fashion. The right vas deferens was brought up to the skin of the right upper scrotum. The skin overlying it was anesthetized with 1% lidocaine without epinephrine, anesthetic was also injected alongside the vas deferens in the direction of the inguinal canal. The no scalpel vasectomy instrument was used to make a small perforation in the scrotal skin. The vasectomy clamp was used to grasp the vas deferens. It was carefully dissected free from surrounding structures. A 1cm segment of the vas was removed, and the cut ends of the mucosa were cauterized.  No significant bleeding was noted. The vas deferens was returned to the scrotum. The skin incision was closed with a simple interrupted stitch of 4-0 chromic. ? ?Attention was then turned to the left side. The left vasectomy was performed in the same exact fashion. Sterile dressings were placed over each incision. The patient tolerated the procedure well. ? ?IMPRESSION/DIAGNOSIS: ?The patient is a 46 year old gentleman who underwent a vasectomy today. Post-procedure instructions were reviewed. I stressed the importance of continuing to use birth control until he provides a semen specimen more than 2 months from now that demonstrates azoospermia. ? ?We discussed return precautions including fever over 101, significant bleeding or hematoma, or uncontrolled pain. I also stressed the importance of avoiding strenuous activity for one week, no sexual activity or ejaculations for 5 days, intermittent icing over the next 48 hours, and scrotal support.  ? ?PLAN: ?The patient will be advised of his semen analysis results when available. ? ?Legrand Rams, MD ?04/01/2022 ? ?

## 2022-04-01 NOTE — Patient Instructions (Signed)

## 2022-07-03 ENCOUNTER — Other Ambulatory Visit: Payer: 59

## 2022-07-04 ENCOUNTER — Encounter: Payer: Self-pay | Admitting: Urology

## 2022-07-08 ENCOUNTER — Other Ambulatory Visit: Payer: 59

## 2022-07-08 DIAGNOSIS — Z3009 Encounter for other general counseling and advice on contraception: Secondary | ICD-10-CM

## 2022-07-09 LAB — POST-VAS SPERM EVALUATION,QUAL: Volume: 1.1 mL

## 2022-07-10 ENCOUNTER — Telehealth: Payer: Self-pay

## 2022-07-10 DIAGNOSIS — Z9852 Vasectomy status: Secondary | ICD-10-CM

## 2022-07-10 NOTE — Telephone Encounter (Signed)
Called pt informed him of the information below. Appt scheduled. Lab ordered.  

## 2022-07-10 NOTE — Telephone Encounter (Signed)
-----   Message from Sondra Come, MD sent at 07/09/2022 12:00 PM EDT ----- Sperm still present, encourage ejaculations to clear residual sperm, repeat test in 6 weeks  Legrand Rams, MD 07/09/2022

## 2022-08-25 ENCOUNTER — Encounter: Payer: Self-pay | Admitting: Urology

## 2022-08-25 ENCOUNTER — Other Ambulatory Visit: Payer: 59

## 2022-09-17 ENCOUNTER — Encounter: Payer: Self-pay | Admitting: Urology

## 2022-09-17 ENCOUNTER — Other Ambulatory Visit: Payer: 59

## 2023-01-17 IMAGING — CR DG FINGER THUMB 2+V*L*
3 series · 3 of 3 positions shown · non-contrast
Comparison: None.

CLINICAL DATA: Status post laceration.

EXAM:
LEFT THUMB 2+V

[finger ap]
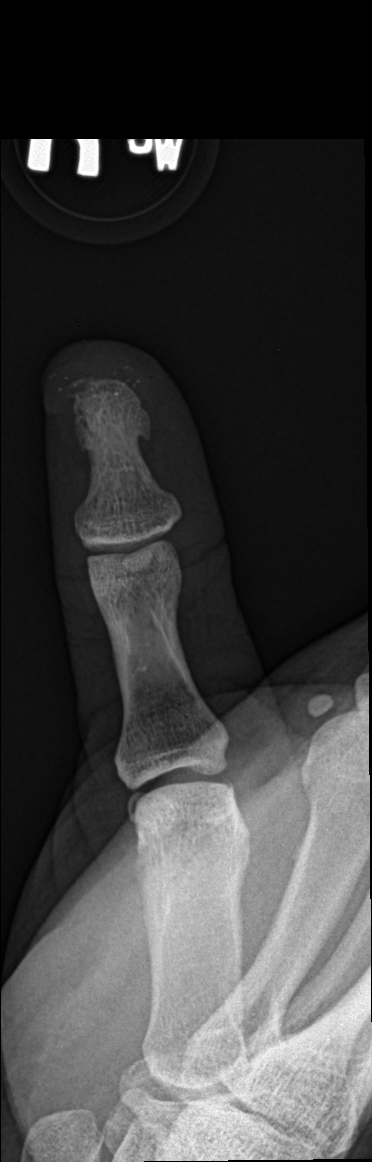

[finger obl]
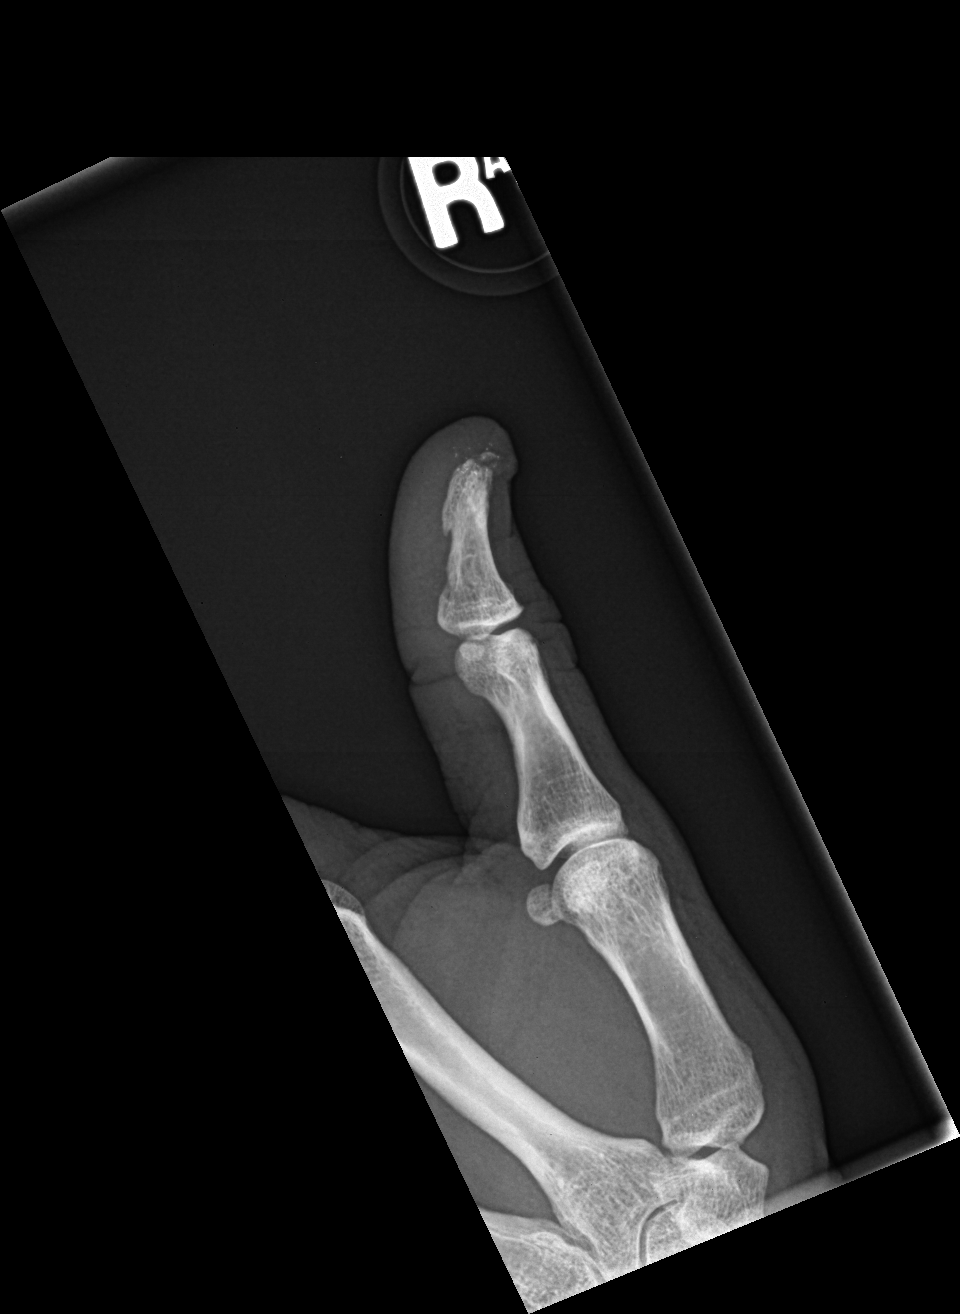

[finger lat]
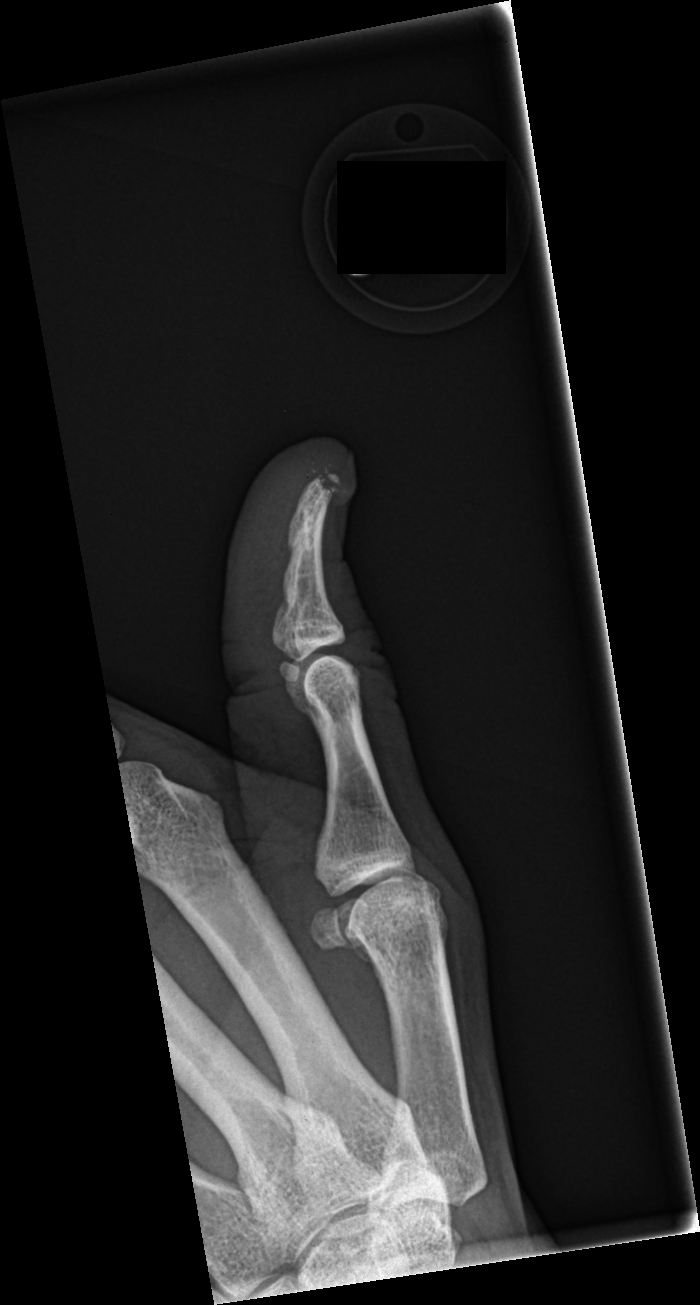

[3 of 3 positions shown; findings below may reference images not displayed]

FINDINGS: Acute fracture deformity is seen involving the tuft of the distal
phalanx of the right thumb. There is no evidence of dislocation. An
ill-defined soft tissue deformity is seen adjacent to the previously
noted fracture site.
IMPRESSION: Acute fracture of the distal phalanx of the right thumb.
